# Patient Record
Sex: Female | Born: 1975 | Race: White | Hispanic: No | Marital: Married | Smoking: Never smoker
Health system: Southern US, Community
[De-identification: ages and names within clinical notes are randomized; demographics above are authoritative.]

## PROBLEM LIST (undated history)

## (undated) HISTORY — PX: ABDOMINAL HYSTERECTOMY: SHX81

---

## 2000-05-08 ENCOUNTER — Other Ambulatory Visit: Admission: RE | Admit: 2000-05-08 | Discharge: 2000-05-08 | Payer: Self-pay | Admitting: Obstetrics & Gynecology

## 2000-12-31 ENCOUNTER — Ambulatory Visit (HOSPITAL_COMMUNITY): Admission: RE | Admit: 2000-12-31 | Discharge: 2000-12-31 | Payer: Self-pay

## 2000-12-31 ENCOUNTER — Emergency Department (HOSPITAL_COMMUNITY): Admission: EM | Admit: 2000-12-31 | Discharge: 2000-12-31 | Payer: Self-pay

## 2001-02-25 ENCOUNTER — Emergency Department (HOSPITAL_COMMUNITY): Admission: EM | Admit: 2001-02-25 | Discharge: 2001-02-26 | Payer: Self-pay

## 2001-03-23 ENCOUNTER — Other Ambulatory Visit: Admission: RE | Admit: 2001-03-23 | Discharge: 2001-03-23 | Payer: Self-pay | Admitting: Gynecology

## 2001-10-24 ENCOUNTER — Observation Stay (HOSPITAL_COMMUNITY): Admission: AD | Admit: 2001-10-24 | Discharge: 2001-10-25 | Payer: Self-pay | Admitting: Gynecology

## 2001-10-25 ENCOUNTER — Encounter: Payer: Self-pay | Admitting: Gynecology

## 2001-10-27 ENCOUNTER — Inpatient Hospital Stay (HOSPITAL_COMMUNITY): Admission: AD | Admit: 2001-10-27 | Discharge: 2001-10-31 | Payer: Self-pay | Admitting: Gynecology

## 2001-12-09 ENCOUNTER — Other Ambulatory Visit: Admission: RE | Admit: 2001-12-09 | Discharge: 2001-12-09 | Payer: Self-pay | Admitting: *Deleted

## 2002-06-08 ENCOUNTER — Other Ambulatory Visit: Admission: RE | Admit: 2002-06-08 | Discharge: 2002-06-08 | Payer: Self-pay | Admitting: Gynecology

## 2002-11-24 ENCOUNTER — Inpatient Hospital Stay (HOSPITAL_COMMUNITY): Admission: AD | Admit: 2002-11-24 | Discharge: 2002-11-24 | Payer: Self-pay | Admitting: Obstetrics and Gynecology

## 2002-11-25 ENCOUNTER — Inpatient Hospital Stay (HOSPITAL_COMMUNITY): Admission: AD | Admit: 2002-11-25 | Discharge: 2002-11-25 | Payer: Self-pay | Admitting: Obstetrics and Gynecology

## 2002-11-29 ENCOUNTER — Inpatient Hospital Stay (HOSPITAL_COMMUNITY): Admission: AD | Admit: 2002-11-29 | Discharge: 2002-12-01 | Payer: Self-pay | Admitting: Obstetrics and Gynecology

## 2003-02-23 ENCOUNTER — Other Ambulatory Visit: Admission: RE | Admit: 2003-02-23 | Discharge: 2003-02-23 | Payer: Self-pay | Admitting: Obstetrics and Gynecology

## 2003-05-13 ENCOUNTER — Inpatient Hospital Stay (HOSPITAL_COMMUNITY): Admission: AD | Admit: 2003-05-13 | Discharge: 2003-05-13 | Payer: Self-pay | Admitting: Obstetrics and Gynecology

## 2003-09-08 ENCOUNTER — Encounter: Admission: RE | Admit: 2003-09-08 | Discharge: 2003-09-08 | Payer: Self-pay | Admitting: Sports Medicine

## 2004-07-05 ENCOUNTER — Emergency Department (HOSPITAL_COMMUNITY): Admission: EM | Admit: 2004-07-05 | Discharge: 2004-07-06 | Payer: Self-pay | Admitting: Emergency Medicine

## 2004-07-12 ENCOUNTER — Other Ambulatory Visit: Admission: RE | Admit: 2004-07-12 | Discharge: 2004-07-12 | Payer: Self-pay | Admitting: Obstetrics and Gynecology

## 2004-08-10 ENCOUNTER — Ambulatory Visit (HOSPITAL_COMMUNITY): Admission: RE | Admit: 2004-08-10 | Discharge: 2004-08-10 | Payer: Self-pay | Admitting: Obstetrics and Gynecology

## 2004-11-21 ENCOUNTER — Inpatient Hospital Stay (HOSPITAL_COMMUNITY): Admission: AD | Admit: 2004-11-21 | Discharge: 2004-11-21 | Payer: Self-pay | Admitting: Obstetrics and Gynecology

## 2005-04-09 ENCOUNTER — Inpatient Hospital Stay (HOSPITAL_COMMUNITY): Admission: AD | Admit: 2005-04-09 | Discharge: 2005-04-09 | Payer: Self-pay | Admitting: Obstetrics and Gynecology

## 2005-05-23 ENCOUNTER — Inpatient Hospital Stay (HOSPITAL_COMMUNITY): Admission: AD | Admit: 2005-05-23 | Discharge: 2005-05-23 | Payer: Self-pay | Admitting: Obstetrics and Gynecology

## 2005-06-04 ENCOUNTER — Inpatient Hospital Stay (HOSPITAL_COMMUNITY): Admission: AD | Admit: 2005-06-04 | Discharge: 2005-06-07 | Payer: Self-pay | Admitting: Obstetrics and Gynecology

## 2005-06-04 ENCOUNTER — Encounter (INDEPENDENT_AMBULATORY_CARE_PROVIDER_SITE_OTHER): Payer: Self-pay | Admitting: *Deleted

## 2005-09-26 ENCOUNTER — Encounter (INDEPENDENT_AMBULATORY_CARE_PROVIDER_SITE_OTHER): Payer: Self-pay | Admitting: Specialist

## 2005-09-26 ENCOUNTER — Ambulatory Visit (HOSPITAL_COMMUNITY): Admission: RE | Admit: 2005-09-26 | Discharge: 2005-09-27 | Payer: Self-pay | Admitting: Obstetrics and Gynecology

## 2009-09-07 ENCOUNTER — Emergency Department (HOSPITAL_COMMUNITY): Admission: EM | Admit: 2009-09-07 | Discharge: 2009-09-07 | Payer: Self-pay | Admitting: Emergency Medicine

## 2009-09-07 ENCOUNTER — Observation Stay (HOSPITAL_COMMUNITY): Admission: EM | Admit: 2009-09-07 | Discharge: 2009-09-08 | Payer: Self-pay | Admitting: Emergency Medicine

## 2009-09-07 ENCOUNTER — Ambulatory Visit: Payer: Self-pay | Admitting: Internal Medicine

## 2010-03-29 LAB — POCT CARDIAC MARKERS
Myoglobin, poc: 45.1 ng/mL (ref 12–200)
Troponin i, poc: <0.05 ng/mL (ref 0.00–0.09)

## 2010-03-29 LAB — DIFFERENTIAL
Basophils Absolute: 0 10*3/uL (ref 0.0–0.1)
Eosinophils Absolute: 0.1 10*3/uL (ref 0.0–0.7)
Lymphs Abs: 1.5 10*3/uL (ref 0.7–4.0)
Monocytes Relative: 7 % (ref 3–12)
Neutro Abs: 3.8 10*3/uL (ref 1.7–7.7)

## 2010-03-29 LAB — CBC
HCT: 39.2 % (ref 36.0–46.0)
Hemoglobin: 13 g/dL (ref 12.0–15.0)
MCH: 29.1 pg (ref 26.0–34.0)
MCHC: 33.2 g/dL (ref 30.0–36.0)
Platelets: 236 10*3/uL (ref 150–400)
RDW: 12.7 % (ref 11.5–15.5)

## 2010-03-29 LAB — POCT I-STAT, CHEM 8
BUN: 8 mg/dL (ref 6–23)
Calcium, Ion: 1.12 mmol/L (ref 1.12–1.32)
Chloride: 106 mEq/L (ref 96–112)
Creatinine, Ser: 1 mg/dL (ref 0.4–1.2)
Hemoglobin: 14.3 g/dL (ref 12.0–15.0)
Potassium: 4.2 mEq/L (ref 3.5–5.1)

## 2010-06-01 NOTE — H&P (Signed)
NAMEREDA, CITRON             ACCOUNT NO.:  [id number]   MEDICAL RECORD NO.:  [id number]           PATIENT TYPE:   LOCATION:                                [facility]   PHYSICIAN:  Dineen Kid. Rana Snare, M.D.    DATE OF BIRTH:  1975-07-13   DATE OF ADMISSION:  09/26/2005  DATE OF DISCHARGE:                                HISTORY & PHYSICAL   HISTORY OF PRESENT ILLNESS:  Ms. Stansel is a 35 year old G3, P3 with  worsening dyspareunia and pelvic pain over the last number of years.  She  recently underwent a cesarean section in May for __________ had her tubal  ligation at that time, but continued to have pain intermittently, chronic,  with predilection towards the left side.  She also does have a history of  endometriosis.  She desires definitive surgical intervention and requests a  hysterectomy and presents for this.   PAST MEDICAL HISTORY:  1. Significant for obesity.  2. __________ anxiety disorder with panic disorder, currently on Zoloft.   PAST SURGICAL HISTORY:  1. History of laparoscopy 3 years ago for endometriosis.  2. Also history of cesarean section in May with a tubal ligation.   PHYSICAL EXAMINATION:  VITAL SIGNS:  Blood pressure 112/80, weight is 305.9.  HEART:  Regular rate and rhythm.  LUNGS:  Clear to auscultation bilaterally.  ABDOMEN:  Obese, nontender.  PELVIC EXAM:  The uterus is anteverted and mobile, minimally tender to deep  palpation.  No adnexal masses are palpable.  Good descensus with Valsalva.  No significant cystocele or rectocele are noted.   Hemoglobin is 12.3, urinalysis is negative.   IMPRESSION/PLAN:  1. Pelvic pain, chronic; history of endometriosis; also dyspareunia.  This      is disabling for the patient at times.  She desires definitive surgical      intervention and desires laparoscopically assisted vaginal      hysterectomy.  2. Possible left salpingo-oophorectomy depending upon evaluation at the      time of the surgery.  3. She  does want preservation of the right ovary.   The risks and benefits of the procedure were discussed at length which;  include but are not limited to, risk of infection, bleeding, damage to the  bowel, bladder, ureters, ovaries, possibly this may not alleviate the pain,  and it could recur or worsen; this is associated with anesthesia, which is  associated with blood loss and blood transfusion.  She does give her  informed consent and wishes to proceed.      Dineen Kid Rana Snare, M.D.  Electronically Signed     DCL/MEDQ  D:  09/25/2005  T:  09/25/2005  Job:  [id number]

## 2010-06-01 NOTE — H&P (Signed)
[name], [name]             ACCOUNT NO.:  [id number]   MEDICAL RECORD NO.:  [id number]          PATIENT TYPE:  AMB   LOCATION:  SDC                           [facility]ILITY:  [facility]   PHYSICIAN:  Dineen Kid. Rana Snare, M.D.    DATE OF BIRTH:  Jan 16, 1975   DATE OF ADMISSION:  08/10/2004  DATE OF DISCHARGE:                                HISTORY & PHYSICAL   HISTORY OF PRESENT ILLNESS:  [name] is a 35 year old G2, P2 with  severe left pelvic pain.  Recently presented to the emergency room near the  end of June for this.  She does have a history of endometriosis.  Ultrasound  in the hospital shows a left ovarian cyst showing 2.3 cm in size with a  normal right appearing ovary.  Most of the acute pain has resolved but she  continues to have intermittent left pelvic pain, mostly with intercourse.  Currently on oral contraceptive agents.  She did have laparoscopy three or  four years ago.  Was placed on birth control pills after that for  endometriosis.  She presents for definitive surgical evaluation and  treatment of the pelvic pain.   PAST MEDICAL HISTORY:  1.  Depression.  2.  Anxiety.   PAST OBSTETRICAL HISTORY:  She has had two deliveries, both vaginally.   MEDICATIONS:  Currently on Ortho Tri-Cyclen and Cymbalta.   ALLERGIES:  SULFA.   PHYSICAL EXAMINATION:  VITAL SIGNS:  Blood pressure 108/72, weight 304.  HEART:  Regular rate and rhythm.  LUNGS:  Clear to auscultation bilaterally.  PELVIC:  Uterus is anteverted, mobile, nontender to deep palpation.  No  adnexal masses are palpable on pelvic examination.  However, ultrasound does  show a left ovarian cyst measuring 2.3 cm in size.   IMPRESSION AND PLAN:  Pelvic pain, chronic, but intermittent.  Ovarian cyst.  Pelvic pain with the left lower side predominant and history of  endometriosis.  All have been unresponsive to conservative medical  management.  Patient desires definitive surgical evaluation.  Will plan  laparoscopy  with possible CO2, laser ablation of endometriosis, possible  ovarian cystectomy, possible oophorectomy.  The procedure was discussed at  length with the patient.  Questions were answered and informed consent was  obtained.  She does understand that this may or may not alleviate the pain,  pain may recur.  There are risks associated with surgery including risk of  infection, bleeding, damage to uterus, tubes, ovaries, bowel, bladder, risks  associated with anesthesia, risks associated with blood transfusion.  She  gives her informed consent and wishes to proceed.     DCL/MEDQ  D:  08/10/2004  T:  08/10/2004  Job:  [id number]

## 2010-06-01 NOTE — Discharge Summary (Signed)
   [name], [name]                         ACCOUNT NO.:  [id number]   MEDICAL RECORD NO.:  [id number]                   PATIENT TYPE:  INP   LOCATION:  9135                                 [facility]ACILITY:  [facility]   PHYSICIAN:  Juan H. Lily Peer, M.D.             DATE OF BIRTH:  09-14-1975   DATE OF ADMISSION:  10/27/2001  DATE OF DISCHARGE:  10/31/2001                                 DISCHARGE SUMMARY   DISCHARGE DIAGNOSES:  1. Intrauterine pregnancy at term.  2. Pregnancy induced hypertension.  3. Bradycardia.  4. Occiput posterior position.   PROCEDURE:  Left forceps assisted vaginal delivery with delivery of viable  infant.   HISTORY OF PRESENT ILLNESS:  The patient is a 35 year old primigravida with  an LMP January 19, 2001, [facility] December 26, 2001.  Prenatal course complicated  by history of depression, anxiety.  The patient is on Zoloft 100 mg q.d.   LABORATORIES:  Blood type A+.  Antibody screen negative.  RPR, HBSAG, HIV  nonreactive.   HOSPITAL COURSE:  The patient was admitted at 40 weeks with elevated blood  pressures, nausea, vomiting, decreased fetal movement.  Induction of labor  was initiated with Cervidil followed by high dose Pitocin.  PIH panels were  drawn.  The patient did progress to complete dilatation and secondary to  bradycardia and OP position delivery was accomplished by low forceps  assisted vaginal delivery with repair of third degree laceration.  Infant  was female.  Birth weight 6 pounds 3 ounces.  Postpartum course patient  remained afebrile.  Had no difficulty voiding.  Was able to be discharged in  satisfactory condition on her second postpartum day.  CBC:  Hematocrit 25.1,  hemoglobin 8.6, platelets 175,000.   DISPOSITION:  Follow up in the office in six weeks postpartum visit.  Tylox.  Continue prenatal vitamins and iron.     Elwyn Lade . Hancock, N.P.                Gaetano Hawthorne. Lily Peer, M.D.    MKH/MEDQ  D:  01/04/2002  T:  01/04/2002  Job:   [id number]

## 2010-06-01 NOTE — Discharge Summary (Signed)
NAMEROLANDO, WHITBY             ACCOUNT NO.:  [id number]   MEDICAL RECORD NO.:  [id number]          PATIENT TYPE:  OIB   LOCATION:  9309                          [facility]   PHYSICIAN:  Dineen Kid. Rana Snare, M.D.    DATE OF BIRTH:  1975/03/11   DATE OF ADMISSION:  09/26/2005  DATE OF DISCHARGE:  09/27/2005                                 DISCHARGE SUMMARY   HISTORY OF PRESENT ILLNESS:  Ms. Channing is a 35 year old, G2, P3, with  worsening dyspareunia and pelvic pain over the last number of years.  Recently underwent a cesarean section with tubal ligation in May.  Is  continuing to have intermittent but chronic pain __________ left side.  She  desires definitive surgical intervention and presents for hysterectomy.   HOSPITAL COURSE:  The patient underwent a laparoscopically-assisted vaginal  hysterectomy.  The surgery was uncomplicated.  She did have estimated blood  loss of 500 cc.  Her postoperative care was unremarkable.  She had good  return of bowel function.  By postoperative day #1, was ambulating without  difficulty, tolerating a regular diet.  Had normoactive bowel sounds.  The  incision was clean, dry, and intact.  Postoperative hemoglobin was 9.6.  The  patient was discharged home to followup in the office in 1 week.  She was  sent home with a prescription for Tylox, #30.  She was told to return for  increased pain, fever or bleeding.      Dineen Kid Rana Snare, M.D.  Electronically Signed     DCL/MEDQ  D:  09/27/2005  T:  09/28/2005  Job:  [id number]

## 2010-06-01 NOTE — Op Note (Signed)
[name], [name]             ACCOUNT NO.:  [id number]   MEDICAL RECORD NO.:  [id number]          PATIENT TYPE:  OIB   LOCATION:  9309                          [facility]   PHYSICIAN:  Dineen Kid. Rana Snare, M.D.    DATE OF BIRTH:  07-28-1975   DATE OF PROCEDURE:  09/26/2005  DATE OF DISCHARGE:                                 OPERATIVE REPORT   PREOPERATIVE DIAGNOSIS:  Pelvic pain, dyspareunia, and morbid obesity.   POSTOPERATIVE DIAGNOSIS:  Pelvic pain, dyspareunia, and morbid obesity.   PROCEDURE:  Laparoscopically-assisted vaginal hysterectomy.   SURGEON:  Dineen Kid. Rana Snare, M.D.   ASSISTANT:  Zelphia Cairo, M.D.   ANESTHESIA:  General endotracheal.   INDICATIONS FOR PROCEDURE:  [name] is a 35 year old gravida 3, para  2, with worsening dyspareunia and pelvic pain over the last number of years.  She underwent a cesarean section in May and a tubal ligation at that time.  She has continued to have pain intermittently, but daily which is  interfering with her ability to have a normal life.  She desires definitive  surgical intervention and requests hysterectomy.  We plan to proceed with  laparoscopically-assisted vaginal hysterectomy.  The patient also does have  morbid obesity and history of endometriosis.  The risks and benefits were  discussed at length.  Informed consent was obtained.  See history and  physical for the details.   FINDINGS:  Slightly enlarged boggy uterus, normal appearing ovaries and  fallopian tubes, normal appearing liver.   DESCRIPTION OF PROCEDURE:  After adequate analgesia, the patient was placed  in the dorsal lithotomy position.  She was sterilely prepped and draped.  The bladder was sterilely drained.  Hulka tenaculum was placed on the  cervix.  A 1 cm infraumbilical skin incision was made, Veress needle was  inserted.  The abdomen was insufflated to dullness to percussion.  An 11 mm  trocar was then inserted.  The laparoscope was inserted.   The above findings  were noted.  A 5 mm trocar was inserted to the left of the midline two  fingerbreadths above the pubic symphysis.  The Gyrus grasping forceps were  used to grasp across the left utero-ovarian ligament, coagulating and  dissecting across the infundibulopelvic ligament, across the fallopian tube,  and down across the round ligament on the left side.  This was done  similarly across the utero-ovarian ligament on the right side, down across  the round ligament.  The bladder was elevated and a small window was made at  the uterovesical junction.  Legs were repositioned.  Abdomen was  desufflated.  Posterior colpotomy was performed.  The cervix was  circumscribed with Bovie cautery.  A LigaSure instrument was used to ligate  across the uterosacral ligaments bilaterally, the cardinal ligaments  bilaterally, the bladder pillars bilaterally, dissected with Mayo scissors,  and then the anterior vaginal mucosa was dissected off the bladder.  A  Deaver retractor was placed into the anterior peritoneum protecting the  bladder.  The inferior portions of the broad ligament were then clamped with  LigaSure, ligated, and cut with Mayo  scissors.  The uterus was then removed.  Uterosacral ligaments were ligated with 0 Monocryl suture.  Small bleeders  were noted and coagulated with LigaSure instrument.  Posterior peritoneum  was closed with pursestring fashion using 0 Monocryl suture.  The posterior  vaginal mucosa was closed in a vertical fashion using figure-of-eights of 0  Monocryl.  The speculum was removed and the anterior vaginal mucosa was  closed in a vertical using figure-of-eights of 0 Monocryl.  There were  several small lacerations near the apex from the retractor that were  repaired with 0 Monocryl suture and Bovie cautery.  Small packing was placed  into the vagina for direct pressure at this time.  Foley catheter was placed  and clear yellow urine returned.  The legs were  repositioned, the abdomen  reinsufflated and the Nezhat suction irrigator was used to irrigate the  pelvis.  Small peritoneal bleeders were grasped and coagulated with bipolar  cautery and after copious amount of irrigation and adequate hemostasis was  assured, reexamination of the pedicles revealed good hemostasis.  No  underlying injury to the bowel or ureter were noted.  The abdomen was then  desufflated.  The infraumbilical trocar was removed and closed with 0 Vicryl  interrupted suture in the fascia, 3-0 Vicryl Rapide subcuticular suture.  5  mm site was closed with 3-0 Vicryl Rapide subcuticular suture.  The  incisions were injected with 0.25% Marcaine total of 10 mL used.  The  patient was stable on transfer to the recovery room.  Needle, sponge, and  instrument counts correct x3.  Estimated blood loss was 500 mL mostly due to  bleeding from the vaginal cuff.  The patient received 1 gram of Rocephin  preoperatively.      Dineen Kid Rana Snare, M.D.  Electronically Signed     DCL/MEDQ  D:  09/26/2005  T:  09/26/2005  Job:  [id number]

## 2010-06-01 NOTE — Op Note (Signed)
[name], [name]             ACCOUNT NO.:  [id number]   MEDICAL RECORD NO.:  [id number]          PATIENT TYPE:  AMB   LOCATION:  SDC                           [facility]   PHYSICIAN:  Dineen Kid. Rana Snare, M.D.    DATE OF BIRTH:  Sep 03, 1975   DATE OF PROCEDURE:  08/10/2004  DATE OF DISCHARGE:                                 OPERATIVE REPORT   PREOPERATIVE DIAGNOSES:  1.  Pelvic pain chronic in nature.  2.  Ovarian cyst.  3.  History of endometriosis.   POSTOPERATIVE DIAGNOSES:  1.  Pelvic pain chronic in nature.  2.  Ovarian cyst.  3.  History of endometriosis.  4.  Probable adenomyosis.   PROCEDURE:  Diagnostic laparoscopy.   SURGEON:  Dineen Kid. Rana Snare, M.D.   ANESTHESIA:  General endotracheal.   INDICATIONS FOR PROCEDURE:  [name] is a 35 year old, G2, P2 with  severe left pelvic pain recently seen in the emergency room for this. She  does have a history of endometriosis. Ultrasound at the hospital showed a  left ovarian cyst measuring 2.3 cm in size. The acute pain has mostly  resolved but she does continue to have intermittent pain mostly with  predominance on the left side and mostly with intercourse. Currently on oral  contraceptive agents. She did have a laparoscopy 3-4 years ago showing mild  endometriosis and was placed on birth control pills since then. She presents  for definitive surgical intervention and evaluation of this.   FINDINGS:  Normal appearing left tube and ovary. The right tube and ovary  are also normal. The uterus is slightly enlarged and consistent with  adenomyosis. Cul-de-sac normal, no evidence of endometriosis noted. The  appendix is retrocecal.   DESCRIPTION OF PROCEDURE:  After adequate analgesia, the patient was placed  in the dorsal lithotomy position, she was sterilely prepped and draped, the  bladder sterilely drained. A Grave's speculum was placed, tenaculum was  placed on the anterior lip of the cervix. Once an infraumbilical  incision  was made, the Veress needle was inserted, the abdomen was insufflated with  dullness to percussion. An 11 mm trocar was inserted, the laparoscope was  inserted, the above findings were noted. A 5 mm trocar was inserted to the  left of the midline, two fingerbreadth's above the pubic symphysis under  direct visualization. After careful examination throughout the pelvis and  cul-de-sac, no evidence of endometriosis was noted. Normal appearing tubes  and ovaries with special attention paid towards the left adnexa. No obvious  cause of the left adnexal pain. The uterus was slightly enlarged and when  placing a probe underneath the elevate the uterus it was very boggy  consistent with adenomyosis. At this time, the abdomen was then desufflated,  trocars removed, the infraumbilical skin incision was closed with a #0  Vicryl interrupted suture, 4-0 Vicryl Rapide subcuticular suture. The 5 mm  site was closed with a 4-0 Vicryl Rapide subcuticular suture. The tenaculum  was removed and the cervix noted to be hemostatic. The patient was then  transferred to the recovery room in stable condition. Sponge,  needle and  instrument count was normal x3. Estimated blood loss was minimal. The  patient received 1 g of Rocephin preoperatively, 30 mg of Toradol  postoperatively.   DISPOSITION:  The patient will be discharged home, followup in the office in  2-3 weeks. Sent home with a routine instruction sheet for laparoscopy and  told to return for increased pain, fever or bleeding.       DCL/MEDQ  D:  08/10/2004  T:  08/10/2004  Job:  [id number]

## 2010-06-01 NOTE — H&P (Signed)
[name], [name]                         ACCOUNT NO.:  [id number]   MEDICAL RECORD NO.:  [id number]                   PATIENT TYPE:  INP   LOCATION:  9174                                 [facility]   PHYSICIAN:  Juan H. Lily Peer, M.D.             DATE OF BIRTH:  [birth year]   DATE OF ADMISSION:  10/27/2001  DATE OF DISCHARGE:                                HISTORY & PHYSICAL   CHIEF COMPLAINT:  1. Elevated blood pressure.  2. Nausea and vomiting.  3. Decreased fetal movement.   HISTORY OF PRESENT ILLNESS:  The patient is a 36 year old G1, P0 last  menstrual period January 29, 2001 with estimated date of confinement October 26, 2001 40-1/7ths weeks' gestation.  Patient was seen in the office today  per follow up instructions since her discharge from the hospital this past  weekend which she was admitted due to the fact that she had called  complaining of elevated blood pressure.  She had taken her blood pressure at  home and thought that it was elevated.  She was also found to have some  variable decelerations during monitoring in maternity admission and was  admitted for observation for [facility] panels and monitoring.  Her PIH panels were  reported to be normal.  She was asymptomatic and her blood pressure returned  back to normal and she was discharged home on October 24, 2001 and was  instructed to return to the office for a follow up appointment.  She  returned today complaining of decreased fetal movement.  She has recently  been treated for a urinary tract infection with Keflex.  Due to the nausea  and vomiting she has had the week to ten days prior, she has not been able  to keep the antibiotic down.  Due to the fact that the patient is obese and  weighs 398 pounds, a nonstress test will be done in the hospital when she is  admitted for induction.  Biophysical profile was done in the office with  score of 6:8 and 2 were taken off for breathing.  Estimated fetal weight  was  recorded 7 pounds 1 ounce with 19th percentile for 40-week gestation.  AFI  was 11.9, 45th percentile for 40 weeks'.  Patient's blood pressure in the  office was 140/90, 130/88 and on her side was 120/68 and 128/82.  She did  have 2-3+ pitting edema.  She denied any right upper quadrant pain, visual  disturbances or any headache at the present time except for the vomiting she  had for three days.  She will be admitted for induction this evening.  She  was seen in the office at approximately 18:30 hours on October 27, 2001.   PRENATAL COURSE:  Her first prenatal blood pressure was 124/82.  She has had  negative proteinuria throughout her pregnancy so far.  She has been on  Lexapro 100 mg q.d. for  depression and anxiety.   PAST MEDICAL HISTORY:  Patient, as mentioned above, has history of  depression and anxiety and is on Lexapro 100 mg q. daily.   PAST SURGICAL HISTORY:  She has history of laparoscopy in 2002 when she was  diagnosed with having endometriosis.   ALLERGIES:  She is allergic to SULFA and MACROBID.   REVIEW OF SYSTEMS:  See hospital form.   PHYSICAL EXAMINATION:  GENERAL:  Weight 398 pounds.  Blood pressure 130/90,  130/88 on left side 120/68, 128/82.  HEENT:  Unremarkable.  Neck supple.  Trachea midline.  No carotid bruits or  thyromegaly.  LUNGS:  Clear to auscultation without rhonchi or wheezes.  HEART:  Regular rate and rhythm without murmurs, rubs or gallops.  BREAST:  Not examined.  ABDOMEN:  Fundal height 41 cm, vertex presentation by Thayer Ohm maneuver.  PELVIC:  70% effaced, closed.  EXTREMITIES:  Deep tendon reflexes 2+, 3+ pitting edema, negative clonus.   LABORATORY DATA:  Prenatal laboratories:  Blood type A+, negative antibody  screen, VDRL nonreactive, rubella immune, hepatitis B surface antigen and  HIV negative.  She had abnormal diabetes screening after Glucola but had  normal three-hour GGT.  She had negative GBS culture and Pap smear was   normal.  Patient had an ultrasound and biophysical profile today with a  biophysical profile of 6:8 with 2 taken off for breathing.  Estimated fetal  weight 7 pounds 1 ounce, 19th percentile, vertex presentation, AFI normal at  11.9 in the 45th percentile for 40 weeks'.   ASSESSMENT:  35 year old G1, P0 at 40-1/7ths weeks' with continuation of  nausea and elevated blood pressures, edema.  No visual disturbances, right  upper quadrant pain or severe headache.  Patient previously seen in the  hospital this past weekend, monitored blood pressures had improved and PIH  panels were reported to be normal.  She was released home and now presented  today to the office late in the afternoon complaining of decreased fetal  movement and now with findings of BPP of 6:8, elevated blood pressure and  the pitting edema. It appears the patient has a variant of PIH underlying.  Will go ahead and admit for cervical ripening with Cervidil tonight and with  high-dose Pitocin at 6:00 a.m.  The patient was fully informed of the  potential scenarios to include the following:  Serial induction requiring  one or two days if the cervix is unfavorable and if, only if, the patient is  normotensive and antepartum testing consisting also of nonstress test and  PIH panel are normal.  If not, or blood pressures continue to escalate,  surgical intervention via cesarean section may need to be undertaken.  The  patient is fully aware of this and will follow up accordingly.   PLAN:  1. Admit this evening to [facility].  2. PIH panels to be drawn.  3. Continue fetal monitoring as well as vital signs monitoring.  4. Cervical placement for cervical ripening followed by initiation of     Pitocin high-dose at 6:00 a.m.  5. Risks, benefits and pros and cons of all of the above discussed with     patient in full detail.  All questions were answered.  Follow up     accordingly.  Juan H. Lily Peer, M.D.    JHF/MEDQ  D:  10/27/2001  T:  10/27/2001  Job:  [id number]

## 2010-06-01 NOTE — Op Note (Signed)
[name], [name]             ACCOUNT NO.:  [id number]   MEDICAL RECORD NO.:  [id number]          PATIENT TYPE:  INP   LOCATION:  9131                          FACILITY:  [facility]   PHYSICIAN:  Guy Sandifer. Henderson Cloud, M.D. DATE OF BIRTH:  14-May-1975   DATE OF PROCEDURE:  06/04/2005  DATE OF DISCHARGE:                                 OPERATIVE REPORT   PREOPERATIVE DIAGNOSIS:  1.  Intrauterine pregnancy at 39-0/7 weeks estimated additional age.  2.  Fetal macrosomia.  3.  Desires permanent sterilization.   POSTOPERATIVE DIAGNOSIS:  1.  Intrauterine pregnancy at 39-0/7 weeks estimated additional age.  2.  Fetal macrosomia.  3.  Desires permanent sterilization.   PROCEDURE:  1.  Low transverse cesarean section.  2.  Bilateral tubal ligation.   SURGEON:  Guy Sandifer. Henderson Cloud, M.D.   ASSISTANT:  Dineen Kid. Rana Snare, M.D.   ANESTHESIA:  Spinal, Cristela Blue, M.D.   ESTIMATED BLOOD LOSS:  800 mL.   SPECIMENS:  Placenta and bilateral tubal segments.   FINDINGS:  Viable female infant, Apgars of 8 and 9 at 1 and 5 inches  respectively.  Birth weight 8 pounds 1 ounce.  Arterial cord pH 7.33.   INDICATIONS AND CONSENT:  The patient is 35 year old married white female  G3, P2, with an EDC of Jun 11, 2005.  Prenatal care has been complicated by  a history of PIH with her previous pregnancies.  She has also been size  greater than dates.  Estimated fetal weight 2 1/2 weeks' ago was 7 pounds 9  ounces at the 93rd percentile.  She has a history of difficult deliveries  with two babies under 7 pounds.  Cervical exam is 3 cm but -3 station.  Today in the office, her blood pressure was noted to have a diastolic of  100.  She was without central nervous system changes or epigastric pain.  After discussing the options with the patient, the patient desires primary  cesarean section and bilateral tubal ligation.  The potential risks and  complications have been discussed preoperatively including but limited  to  infection, bowel, bladder, or ureteral damage, bleeding requiring  transfusion of blood products with possible transfusion reaction, HIV,  hepatitis acquisition.  Tubal ligation with the permanence, failure rate,  and increased ectopic risk has also been reviewed.  All questions were  answered and consent is signed on the chart.   PROCEDURE:  The patient is taken to the operating room where she is  identified, spinal anesthetic is placed, and she is placed in dorsal supine  position with a 15 degrees left lateral wedge.  She is then prepped, Foley  catheter was placed, the bladder was drained, and she is draped in a sterile  fashion.  After testing for adequate spinal anesthesia, the skin was entered  through a Pfannenstiel incision and dissection is carried out in layers to  the peritoneum.  The peritoneum is entered sharply and extended bluntly.  Vesicouterine peritoneum was taken down cephalad laterally.  The bladder  flap was developed and the bladder blade was placed.  The uterus was incised  in a low transverse manner and the uterine cavity is entered bluntly with a  hemostat.  The uterine incision was then extended cephalad and laterally  with the fingers.  Artificial rupture of membranes with clear fluid is  carried out.  Vertex delivered and oronasal pharynx was suctioned.  A double  nuchal cord was then reduced.  The baby is delivered with good cry and tone  noted.  The cord was clamped and cut and the baby is handed to the awaiting  pediatrics team.  The placenta was manually delivered.  The uterus is  delivered from the incision.  The cavity is cleaned.  The uterus was closed  in a running locking layer of 0 Monocryl suture.  To control bleeding at the  left angle, a figure-of-eight and then an O'Leary stitch is placed with the  Monocryl.  Care was taken to protect structures behind the uterus when this  was carried out.  The left fallopian tube was identified from cornu  to  fimbria and grasped in its mid ampullary portion with a Babcock clamp.  The  knuckle of tube was then doubly ligated with two free ties of 0 plain  suture.  The intervening knuckle was then sharply resected.  Cautery is  applied to assure complete hemostasis.  A similar procedure was carried out  on the right side.  The uterus then returned to the abdomen.  Careful  inspection reveals continued hemostasis at all sites.  The anterior  peritoneum was then closed in a running fashion with 0 Monocryl suture which  was also used to reapproximate the pyramidalis muscle in the midline.  The  rectus fascia is then closed in a running manner with the 0 PDS suture  starting at each angle and meeting in the middle.  The skin is closed with  clips.  All sponge, instrument, and needle counts were correct and the  patient is transferred to the recovery room in stable condition.      Guy Sandifer Henderson Cloud, M.D.  Electronically Signed     JET/MEDQ  D:  06/04/2005  T:  06/04/2005  Job:  [id number]

## 2010-06-01 NOTE — Discharge Summary (Signed)
[name], [name]             ACCOUNT NO.:  [id number]   MEDICAL RECORD NO.:  [id number]          PATIENT TYPE:  INP   LOCATION:  9131                          [facility]ILITY:  [facility]   PHYSICIAN:  Zelphia Cairo, MD    DATE OF BIRTH:  1975/10/07   DATE OF ADMISSION:  06/04/2005  DATE OF DISCHARGE:  06/07/2005                                 DISCHARGE SUMMARY   ADMISSION DIAGNOSES:  1.  Intrauterine pregnancy at 2 weeks' estimated gestational age.  2.  Fetal macrosomia.  3.  Multiparity, desires permanent sterilization.   DISCHARGE DIAGNOSES:  1.  Status post low transverse cesarean section.  2.  A viable female infant.  3.  Permanent sterilization.   PROCEDURES:  1.  Primary low transverse cesarean section.  2.  Bilateral tubal ligation.   REASON FOR ADMISSION:  Please see written H&P.   HOSPITAL COURSE:  The patient is 35 year old white married female, gravida  3, para 2, that was admitted to [facility] at 32 weeks'  estimated gestational age.  The patient did have a history of PIH with her  previous pregnancies.  This pregnancy is also complicated by macrosomia.  The patient had had an ultrasound 2-1/2 weeks prior to admission, which had  revealed a fetus that was weighing 7 pounds 9 ounces which, was at the 93rd  percentile.  The patient had a history of difficult delivery with two baby's  under 7 pounds.  Cervical exam had revealed cervix dilated to 3 cm; however,  vertex was at a -3 station.  The patient had been seen in the office earlier  in the day with blood pressure noted to have a diastolic of 100.  The  patient did deny any central nervous system changes or epigastric pain.  After reviewing previous history and cervical exam, a decision was made to  proceed with a primary low transverse cesarean section.  Due to multiparity,  the patient also requested permanent sterilization.  The patient was then  admitted to [facility], where she  was then transferred to  the operating room where spinal anesthesia was administered without  difficulty.  A low transverse incision was made with delivery of a viable  female infant weighing 8 pounds 1 ounce with Apgars of 8 at one and 9 at five  minutes.  Arterial cord pH was 7.33.  Permanent sterilization was performed  without difficulty.  The patient tolerated the procedure well and was  transferred to the recovery room in stable condition.  On postoperative day  #1 the patient was without complaint.  Vital signs remained stable.  Blood  pressure was 100/58 to 105/62.  Abdomen is soft with good return of bowel  function.  Fundus is firm and nontender.  Abdominal dressing was noted to be  clean, dry and intact.  Laboratory findings revealed hemoglobin of 8.2,  platelet count 150,000, WBC count of 8.0.  The patient was started on some  iron supplementation.  On postoperative day #2 the patient was without  complaint other than some soreness.  She denied headache, blurred vision or  right  upper quadrant pain.  Vital signs were stable with blood pressure  109/67 to 122/83.  Abdomen soft.  Fundus was firm and nontender.  Abdominal  dressing had been removed revealing an incision that was clean, dry and  intact.  The patient was ambulating well and tolerating a regular diet  without complaints of nausea and vomiting.  On postoperative day #3 the  patient was doing well.  She has good control of her pain with oral  narcotics.  She denied any nausea, vomiting, vital signs were stable.  She  was afebrile.  Abdomen soft.  Fundus was firm and nontender.  Incision was  clean, dry and intact.  Staples were removed and the patient was discharged  home.   CONDITION ON DISCHARGE:  Good.   DIET:  Regular as tolerated.   ACTIVITY:  To do no heavy lifting, no driving x2 weeks, no vaginal entry.   FOLLOW-UP:  Patient to follow up in the office in 1 week for an incision  check.  She is to call for  temperature greater than 100 degrees, persistent  nausea or vomiting, heavy vaginal bleeding and/or redness or drainage from  the incisional site.  The patient was also instructed to call for headache,  blurred vision or right upper quadrant pain.   DISCHARGE MEDICATIONS:  1.  Percocet 5/325, #30, one p.o. every 4-6 hour p.r.n.  2.  Motrin 600 mg every 6 hours.  3.  Prenatal vitamins one p.o. daily.  4.  Colace one p.o. daily p.r.n.      Julio Sicks, N.P.      Zelphia Cairo, MD  Electronically Signed    CC/MEDQ  D:  06/24/2005  T:  06/24/2005  Job:  [id number]

## 2010-06-01 NOTE — H&P (Signed)
NAMEBRAYLINN, [name]                         ACCOUNT NO.:  [id number]   MEDICAL RECORD NO.:  [id number]                   PATIENT TYPE:  INP   LOCATION:  9168                                 [facility]ILITY:  [facility]   PHYSICIAN:  Guy Sandifer. Arleta Creek, M.D.           DATE OF BIRTH:  1975/02/19   DATE OF ADMISSION:  11/29/2002  DATE OF DISCHARGE:                                HISTORY & PHYSICAL   CHIEF COMPLAINT:  Uterine contractions.   HISTORY OF PRESENT ILLNESS:  This patient is a 35 year old white female, G2,  P1, with an EDC of December 12, 2002, placing her at 38-1/7 weeks, who has  been noted to have 1+ proteinuria for the last two weeks or so.  Blood  pressures have been generally 120s/80-90.  On the day of admission she  complains of a frontal headache.  She denies vision changes.  She complains  of some mild epigastric discomfort but no nausea or vomiting.  Denies  leaking fluid or vaginal bleeding, but she does have contractions every five  minutes.  In the office blood pressure was 130/90, there was trace protein  in the urine, and her weight is 321 pounds.  Fetal heart tones are  auscultated.  After discussion of the options, she is being admitted for  induction of labor as well as further evaluation.   PAST MEDICAL HISTORY:  See prenatal form.   PAST SURGICAL HISTORY:  See prenatal form.   FAMILY HISTORY:  See prenatal form.   PAST OBSTETRICAL HISTORY:  See prenatal form.   MEDICATIONS:  Prenatal vitamins.   ALLERGIES:  SULFA.   PHYSICAL EXAMINATION:  VITAL SIGNS:  Weight 321 pounds.  CHEST:  Lungs clear to auscultation.  CARDIAC:  Regular rate and rhythm.  BACK:  Without CVA tenderness.  BREASTS:  Not examined.  ABDOMEN:  Obese, gravid, with mild epigastric tenderness.  The uterus is  soft and nontender.  PELVIC:  Vulva, vagina, and cervix without lesion.  Cervix is 4 cm dilated,  50% effaced, vertex with a high presenting part.  EXTREMITIES:  3+ dependent edema,  and the reflexes are 3+ bilaterally  without clonus.   LABORATORY DATA:  Group B beta strep culture is negative.  Blood type A  positive.  Rh antibody screen negative.  RPR nonreactive.  Rubella titer  positive.  Hepatitis B surface antigen nonreactive.  HIV nonreactive.  PPD  negative.  Gonorrhea and Chlamydia negative.    ASSESSMENT:  Intrauterine pregnancy at 38-1/7 weeks with contractions.   PLAN:  Will admit to the hospital for Pitocin induction of labor.  Will  check laboratories to rule out toxemia.                                               Guy Sandifer  Arleta Creek, M.D.    JET/MEDQ  D:  11/29/2002  T:  11/29/2002  Job:  [id number]

## 2010-06-01 NOTE — Discharge Summary (Signed)
   NAMEJALISIA, [name]                         ACCOUNT NO.:  [id number]   MEDICAL RECORD NO.:  [id number]                   PATIENT TYPE:  INP   LOCATION:  9135                                 [facility]   PHYSICIAN:  Juan H. Lily Peer, M.D.             DATE OF BIRTH:  1975-01-21   DATE OF ADMISSION:  10/27/2001  DATE OF DISCHARGE:  10/31/2001                                 DISCHARGE SUMMARY   DISCHARGE DIAGNOSES:  1. Intrauterine pregnancy 40+ weeks, delivered.  2. Pregnancy induced hypertension.  3. Status post forceps assisted vaginal delivery.  4. Anxiety and depression.   HISTORY:  This is a 35 year old female gravida 1, para 0 with an EDC of  October 26, 2001.  Prenatal course was complicated by anxiety and depression  and was on Lexapro for same.  She also was had been admitted prior to the  delivery admission for elevated blood pressure and had been discharged home  on October 24, 2001.  She followed up to the office on October 27, 2001  complaining of nausea and vomiting, increased blood pressure 140/90, 2-3+  pitting edema and patient was admitted for [facility] for induction.   HOSPITAL COURSE:  On October 27, 2001 patient was admitted at 40+ weeks.  Cervidil was placed and high dose Pitocin was begun on October 28, 2001.  She did receive 2 g of Cefotan for a UTI on admission.  On October 28, 2001  cervix was tight, fingertip, closed, 80%, and high.  Pitocin was turned off.  Cervidil was again placed and Pitocin ______ was begun on October 29, 2001.  On October 29, 2001 at 1555 patient underwent a forceps assisted vaginal  delivery secondary to bradycardia, OP position and underwent delivery of a  female, Apgars of 6 and 8, weight 6 pounds 3 ounces.  There was a partial  third degree laceration that was repaired without complications.  Postpartum  patient remained afebrile, voiding, stable condition.  She was discharged to  home on October 30, 2001 and given [facility]  Gynecology postpartum  instruction/postpartum booklet.   ACCESSORY CLINICAL FINDINGS:  Laboratories:  The patient is A+.  Rubella  immune.  On October 30, 2001 hemoglobin was 8.6.   DISPOSITION:  The patient is discharged to home.  Informed to return to  office six weeks.  Any problems prior to that time to be seen in the office.  Given a prescription for Tylox to take p.r.n. pain, iron daily, continue  Lexapro 10 mg p.o. q.d.     Susa Loffler, P.A.                    Juan H. Lily Peer, M.D.    TSG/MEDQ  D:  12/25/2001  T:  12/25/2001  Job:  [id number]

## 2010-06-01 NOTE — Discharge Summary (Signed)
   NAMESHANDIE, BERTZ                         ACCOUNT NO.:  [id number]   MEDICAL RECORD NO.:  [id number]                   PATIENT TYPE:  OBV   LOCATION:  9165                                 [facility]   PHYSICIAN:  Ivor Costa. Farrel Gobble, M.D.              DATE OF BIRTH:  12-05-1975   DATE OF ADMISSION:  10/24/2001  DATE OF DISCHARGE:  10/25/2001                                 DISCHARGE SUMMARY   DISCHARGE DIAGNOSES:  1. Intrauterine pregnancy 39 weeks, undelivered.  2. Increased blood pressure at home.   HISTORY:  This is a 35 year old female gravida 1 with EDC of October 26, 2001.  Prenatal course had been complicated by anxiety on Zoloft.   HOSPITAL COURSE:  On October 24, 2001 patient called in today on call about  increased blood pressure at home.  The patient had headache and was asked to  treat with Tylenol, then went to triage for nonstress test and laboratory  testing.  All laboratory testing was normal.  Nonstress test reactive,  episodic variables.  BPP was ordered which was within normal limits and  patient was discharged to home to follow up in the office on October 27, 2001 with a nonstress test.  If she had any problem prior to that time to be  seen in the office.     Susa Loffler, P.A.                    Ivor Costa. Farrel Gobble, M.D.    TSG/MEDQ  D:  01/15/2002  T:  01/15/2002  Job:  [id number]

## 2012-04-01 ENCOUNTER — Ambulatory Visit (INDEPENDENT_AMBULATORY_CARE_PROVIDER_SITE_OTHER): Payer: BC Managed Care – PPO | Admitting: Physician Assistant

## 2012-04-01 ENCOUNTER — Encounter: Payer: Self-pay | Admitting: Physician Assistant

## 2012-04-01 VITALS — BP 118/90 | HR 86 | Temp 98.5°F | Resp 16 | Ht 67.0 in | Wt 372.0 lb

## 2012-04-01 DIAGNOSIS — L408 Other psoriasis: Secondary | ICD-10-CM

## 2012-04-01 DIAGNOSIS — L409 Psoriasis, unspecified: Secondary | ICD-10-CM | POA: Insufficient documentation

## 2012-04-01 DIAGNOSIS — R002 Palpitations: Secondary | ICD-10-CM

## 2012-04-01 DIAGNOSIS — F411 Generalized anxiety disorder: Secondary | ICD-10-CM

## 2012-04-01 DIAGNOSIS — Z131 Encounter for screening for diabetes mellitus: Secondary | ICD-10-CM

## 2012-04-01 LAB — GLUCOSE, POCT (MANUAL RESULT ENTRY): POC Glucose: 108 mg/dl — AB (ref 70–99)

## 2012-04-01 MED ORDER — SERTRALINE HCL 50 MG PO TABS: 50.0000 mg | ORAL_TABLET | Freq: Every day | ORAL | Status: DC

## 2012-04-01 NOTE — Progress Notes (Signed)
   [address], [place] [address]   Phone [phone]  Subjective:    Patient ID: [name], female    DOB: 1975-10-13, 37 y.o.   MRN: [id number]  HPI Pt presents to clinic with increasing anxiety over the last year.  She has had problems with anxiety for a long time.  She was on Paxil which worked well until she got pregnant 11 years ago.  Then she was put on Zoloft which really helped and due to not getting a refill and thinking she was fine she went off her Zoloft about 2 years ago and now she knows that was not a great idea.  She is married with 3 boys (10,8,7) and she has a wonderful supportive husband.  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  She is current on weight watchers and has lost 40 #, she has h/o stress eating but lately she has not had an appetite.  Her main problem is that she does not want to leave to house and she is not sleeping well at night.  She is terrified of death and though it got worse when she became a mother she has always been afraid of what will happen to her after she dies.  She is ready to go back on her Zoloft. She has noticed that recently she has been getting shaky and having palpitates which definitely cause her anxiety to get worse but she is unsure is her anxiety is actually causing them or if she has DM or heart problems.  She did not have gestational DM.     Paxil - helped - off when pregnant Prozac - no help Review of Systems  Constitutional: Positive for appetite change (decrease). Negative for unexpected weight change.  Psychiatric/Behavioral: Positive for sleep disturbance and dysphoric mood. Negative for suicidal ideas. The patient is nervous/anxious.        Objective:   Physical Exam  Vitals reviewed. Constitutional: She is oriented to person, place, and time. She appears well-developed and well-nourished.  HENT:  Head: Normocephalic and atraumatic.  Right Ear: External ear normal.  Left Ear: External ear normal.  Cardiovascular: Normal  rate, regular rhythm and normal heart sounds.   Pulmonary/Chest: Effort normal and breath sounds normal.  Neurological: She is alert and oriented to person, place, and time.  Skin: Skin is warm and dry.  Psychiatric: She has a normal mood and affect. Her behavior is normal. Judgment and thought content normal.   EKG - NSR without acute changes.     Assessment & Plan:  Screening for diabetes mellitus (DM) - MIldly elevated glucose - Pt plans to make an appt for a CPE.  Plan: POCT glucose (manual entry)  Palpitations -Believe related to her anxiety.   Plan: EKG 12-Lead  Anxiety state, unspecified - Plan: sertraline (ZOLOFT) 50 MG tablet - Will recheck pt in 1 month to assess her status.  Pt is unsure what her last dose of Zoloft was so we will titrate to effect.  Psoriasis - gets worse with stress.

## 2012-04-27 ENCOUNTER — Other Ambulatory Visit: Payer: Self-pay | Admitting: Physician Assistant

## 2012-05-13 ENCOUNTER — Ambulatory Visit: Payer: BC Managed Care – PPO | Admitting: Physician Assistant

## 2012-05-15 ENCOUNTER — Telehealth: Payer: Self-pay

## 2012-05-15 NOTE — Telephone Encounter (Signed)
[name] - patient calling due to [place] cancelling her appointment. Patient states that she was put on Zoloft and was told to contact office if it did not appear to be working for her.  Her appointment has been cancelled and it is not working at the current dose. Can we increase her dosage until she can come in.  Her best number is [phone]

## 2012-05-20 MED ORDER — SERTRALINE HCL 100 MG PO TABS: 100.0000 mg | ORAL_TABLET | Freq: Every day | ORAL | Status: DC

## 2012-05-20 NOTE — Telephone Encounter (Signed)
Left mssg for her to call me back

## 2012-05-20 NOTE — Telephone Encounter (Signed)
Absolutely - she should double her dose to Zoloft 100mg  to finish up her current supply and I will send in a new Rx for that dosage.  Please let her know that I am sorry that I had to cancel her appt but I look forward to seeing her in a couple of weeks.

## 2012-05-22 NOTE — Telephone Encounter (Signed)
Called her again, left another message for her to call me back.

## 2012-05-25 NOTE — Telephone Encounter (Signed)
Unable to reach letter sent

## 2012-05-27 ENCOUNTER — Ambulatory Visit: Payer: BC Managed Care – PPO | Admitting: Physician Assistant

## 2012-06-10 ENCOUNTER — Ambulatory Visit: Payer: BC Managed Care – PPO | Admitting: Physician Assistant

## 2012-11-15 ENCOUNTER — Emergency Department (HOSPITAL_COMMUNITY): Payer: BC Managed Care – PPO

## 2012-11-15 ENCOUNTER — Emergency Department (HOSPITAL_COMMUNITY)
Admission: EM | Admit: 2012-11-15 | Discharge: 2012-11-15 | Disposition: A | Discharge status: Home / Self Care | Payer: BC Managed Care – PPO | Attending: Emergency Medicine | Admitting: Emergency Medicine

## 2012-11-15 ENCOUNTER — Encounter (HOSPITAL_COMMUNITY): Payer: Self-pay | Admitting: Emergency Medicine

## 2012-11-15 DIAGNOSIS — M79609 Pain in unspecified limb: Secondary | ICD-10-CM

## 2012-11-15 DIAGNOSIS — M25569 Pain in unspecified knee: Secondary | ICD-10-CM | POA: Insufficient documentation

## 2012-11-15 DIAGNOSIS — M79604 Pain in right leg: Secondary | ICD-10-CM

## 2012-11-15 DIAGNOSIS — Z79899 Other long term (current) drug therapy: Secondary | ICD-10-CM | POA: Insufficient documentation

## 2012-11-15 LAB — BASIC METABOLIC PANEL
BUN: 15 mg/dL (ref 6–23)
Calcium: 9.8 mg/dL (ref 8.4–10.5)
Chloride: 101 mEq/L (ref 96–112)
Glucose, Bld: 98 mg/dL (ref 70–99)
Potassium: 3.9 mEq/L (ref 3.5–5.1)
Sodium: 136 mEq/L (ref 135–145)

## 2012-11-15 LAB — CBC WITH DIFFERENTIAL/PLATELET
Basophils Absolute: 0 10*3/uL (ref 0.0–0.1)
Basophils Relative: 0 % (ref 0–1)
Eosinophils Absolute: 0.2 10*3/uL (ref 0.0–0.7)
HCT: 42.2 % (ref 36.0–46.0)
Hemoglobin: 14.1 g/dL (ref 12.0–15.0)
Lymphs Abs: 1.6 10*3/uL (ref 0.7–4.0)
MCH: 29.4 pg (ref 26.0–34.0)
MCHC: 33.4 g/dL (ref 30.0–36.0)
MCV: 87.9 fL (ref 78.0–100.0)
Monocytes Absolute: 0.5 10*3/uL (ref 0.1–1.0)
Platelets: 246 10*3/uL (ref 150–400)
RBC: 4.8 MIL/uL (ref 3.87–5.11)
RDW: 12.8 % (ref 11.5–15.5)
WBC: 7.3 10*3/uL (ref 4.0–10.5)

## 2012-11-15 MED ORDER — OXYCODONE-ACETAMINOPHEN 5-325 MG PO TABS
1.0000 | ORAL_TABLET | Freq: Once | ORAL | Status: AC
  Administered 2012-11-15: 1 via ORAL
  Filled 2012-11-15: qty 1

## 2012-11-15 MED ORDER — OXYCODONE-ACETAMINOPHEN 5-325 MG PO TABS: 1.0000 | ORAL_TABLET | Freq: Four times a day (QID) | ORAL | Status: AC | PRN

## 2012-11-15 NOTE — ED Notes (Signed)
MD at bedside. 

## 2012-11-15 NOTE — ED Notes (Signed)
Pt was seen at [facility] due to pain behind rt knee that radiates down calf x 1 wk.  Here to r/o DVT.

## 2012-11-15 NOTE — ED Provider Notes (Signed)
CSN: [id number]     Arrival date & time 11/15/12  1255 History   First MD Initiated Contact with Patient 11/15/12 1544     Chief Complaint  Patient presents with  . Knee Pain   (Consider location/radiation/quality/duration/timing/severity/associated sxs/prior Treatment) Patient is a 37 y.o. female presenting with knee pain. The history is provided by the patient.  Knee Pain Location:  Leg Time since incident:  1 week Injury: no   Leg location:  R leg Pain details:    Quality:  Aching   Radiates to:  Does not radiate   Severity:  Moderate   Onset quality:  Gradual   Duration:  1 week   Timing:  Constant   Progression:  Unchanged Chronicity:  New Dislocation: no   Foreign body present:  No foreign bodies Prior injury to area:  No Relieved by:  Nothing Worsened by:  Bearing weight Ineffective treatments:  None tried Associated symptoms: no back pain, no fatigue, no fever and no neck pain     History reviewed. No pertinent past medical history. Past Surgical History  Procedure Laterality Date  . Abdominal hysterectomy     Family History  Problem Relation Age of Onset  . Kidney disease Mother   . Diabetes Father    History  Substance Use Topics  . Smoking status: Never Smoker   . Smokeless tobacco: Not on file  . Alcohol Use: No   OB History   Grav Para Term Preterm Abortions TAB SAB Ect Mult Living                 Review of Systems  Constitutional: Negative for fever and fatigue.  HENT: Negative for congestion and drooling.   Eyes: Negative for pain.  Respiratory: Negative for cough and shortness of breath.   Cardiovascular: Negative for chest pain.  Gastrointestinal: Negative for nausea, vomiting, abdominal pain and diarrhea.  Genitourinary: Negative for dysuria and hematuria.  Musculoskeletal: Negative for back pain, gait problem and neck pain.  Skin: Negative for color change.  Neurological: Negative for dizziness and headaches.  Hematological: Negative  for adenopathy.  Psychiatric/Behavioral: Negative for behavioral problems.  All other systems reviewed and are negative.    Allergies  Sulfa antibiotics  Home Medications   Current Outpatient Rx  Name  Route  Sig  Dispense  Refill  . Multiple Vitamins-Minerals (MULTIVITAMIN WITH MINERALS) tablet   Oral   Take 1 tablet by mouth daily.         . sertraline (ZOLOFT) 100 MG tablet   Oral   Take 1 tablet (100 mg total) by mouth daily.   30 tablet   0    BP 157/104  Pulse 89  Temp(Src) 98.4 F (36.9 C) (Oral)  Resp 16  SpO2 98% Physical Exam  Nursing note and vitals reviewed. Constitutional: She is oriented to person, place, and time. She appears well-developed and well-nourished.  HENT:  Head: Normocephalic.  Mouth/Throat: Oropharynx is clear and moist. No oropharyngeal exudate.  Eyes: Conjunctivae and EOM are normal. Pupils are equal, round, and reactive to light.  Neck: Normal range of motion. Neck supple.  Cardiovascular: Normal rate, regular rhythm, normal heart sounds and intact distal pulses.  Exam reveals no gallop and no friction rub.   No murmur heard. Pulmonary/Chest: Effort normal and breath sounds normal. No respiratory distress. She has no wheezes.  Abdominal: Soft. Bowel sounds are normal. There is no tenderness. There is no rebound and no guarding.  Musculoskeletal: Normal range of motion.  She exhibits no edema and no tenderness.  LE's appear symmetrical. 2+ distal pulses.   Mild ttp of right popliteal area and right calf.   Neurological: She is alert and oriented to person, place, and time.  Skin: Skin is warm and dry.  Psychiatric: She has a normal mood and affect. Her behavior is normal.    ED Course  Procedures (including critical care time) Labs Review Labs Reviewed  BASIC METABOLIC PANEL - Abnormal; Notable for the following:    GFR calc non Af Amer 71 (*)    GFR calc Af Amer 82 (*)    All other components within normal limits  CBC WITH  DIFFERENTIAL  TROPONIN I   Imaging Review Dg Chest 2 View  11/15/2012   CLINICAL DATA:  Shortness of breath and left-sided chest pain  EXAM: CHEST  2 VIEW  COMPARISON:  11/06/2012  FINDINGS: The heart size and mediastinal contours are within normal limits. Both lungs are clear. The visualized skeletal structures are unremarkable.  IMPRESSION: No active cardiopulmonary disease.   Electronically Signed   By: Alcide Clever M.D.   On: 11/15/2012 17:01    EKG Interpretation     Ventricular Rate:  63 PR Interval:  119 QRS Duration: 88 QT Interval:  413 QTC Calculation: 423 R Axis:   20 Text Interpretation:  Sinus rhythm Borderline short PR interval            MDM   1. Right leg pain    4:08 PM 37 y.o. female pw right post knee pain x 1 week. Denies injury, pain radiates to calf. Seen at [facility] sent here for [place] to r/o DVT. Pt also noting cp and mild sob for last 3 days, but has not had sx today. Sx last hours at a time and resolve. Pt states this is c/w her anxiety. Wells/Perc neg. Will get [place] and screening labs. Percocet for pain.   6:34 PM: [place] neg for DVT, pt feeling better. Possibly msk cause of her pain. I have discussed the diagnosis/risks/treatment options with the patient and believe the pt to be eligible for discharge home to follow-up with and estab with a pcp. We also discussed returning to the ED immediately if new or worsening sx occur. We discussed the sx which are most concerning (e.g., worsening pain, sob, cp) that necessitate immediate return. Any new prescriptions provided to the patient are listed below.  Discharge Medication List as of 11/15/2012  6:36 PM    START taking these medications   Details  oxyCODONE-acetaminophen (PERCOCET) 5-325 MG per tablet Take 1 tablet by mouth every 6 (six) hours as needed for pain., Starting 11/15/2012, Until Discontinued, Print         Junius Argyle, MD 11/16/12 [phone]

## 2012-11-15 NOTE — Progress Notes (Signed)
VASCULAR LAB PRELIMINARY  PRELIMINARY  PRELIMINARY  PRELIMINARY  Right lower extremity venous Doppler completed.    Preliminary report:  There is no DVT or SVT noted in the right lower extremity.   [name], RVT 11/15/2012, 5:56 PM

## 2013-04-02 ENCOUNTER — Other Ambulatory Visit: Payer: Self-pay | Admitting: Physician Assistant

## 2013-04-02 ENCOUNTER — Ambulatory Visit
Admission: RE | Admit: 2013-04-02 | Discharge: 2013-04-02 | Disposition: A | Discharge status: Home / Self Care | Payer: BC Managed Care – PPO | Source: Ambulatory Visit | Attending: Physician Assistant | Admitting: Physician Assistant

## 2013-04-02 DIAGNOSIS — R0602 Shortness of breath: Secondary | ICD-10-CM

## 2013-04-02 DIAGNOSIS — R7989 Other specified abnormal findings of blood chemistry: Secondary | ICD-10-CM

## 2013-04-02 DIAGNOSIS — R079 Chest pain, unspecified: Secondary | ICD-10-CM

## 2013-04-02 MED ORDER — IOHEXOL 350 MG/ML SOLN
125.0000 mL | Freq: Once | INTRAVENOUS | Status: AC | PRN
  Administered 2013-04-02: 125 mL via INTRAVENOUS

## 2015-06-01 IMAGING — CR DG CHEST 2V
2 series · 2 of 2 positions shown · non-contrast
Comparison: 11/06/2012

CLINICAL DATA: Shortness of breath and left-sided chest pain

EXAM:
CHEST  2 VIEW

[w chest pa]
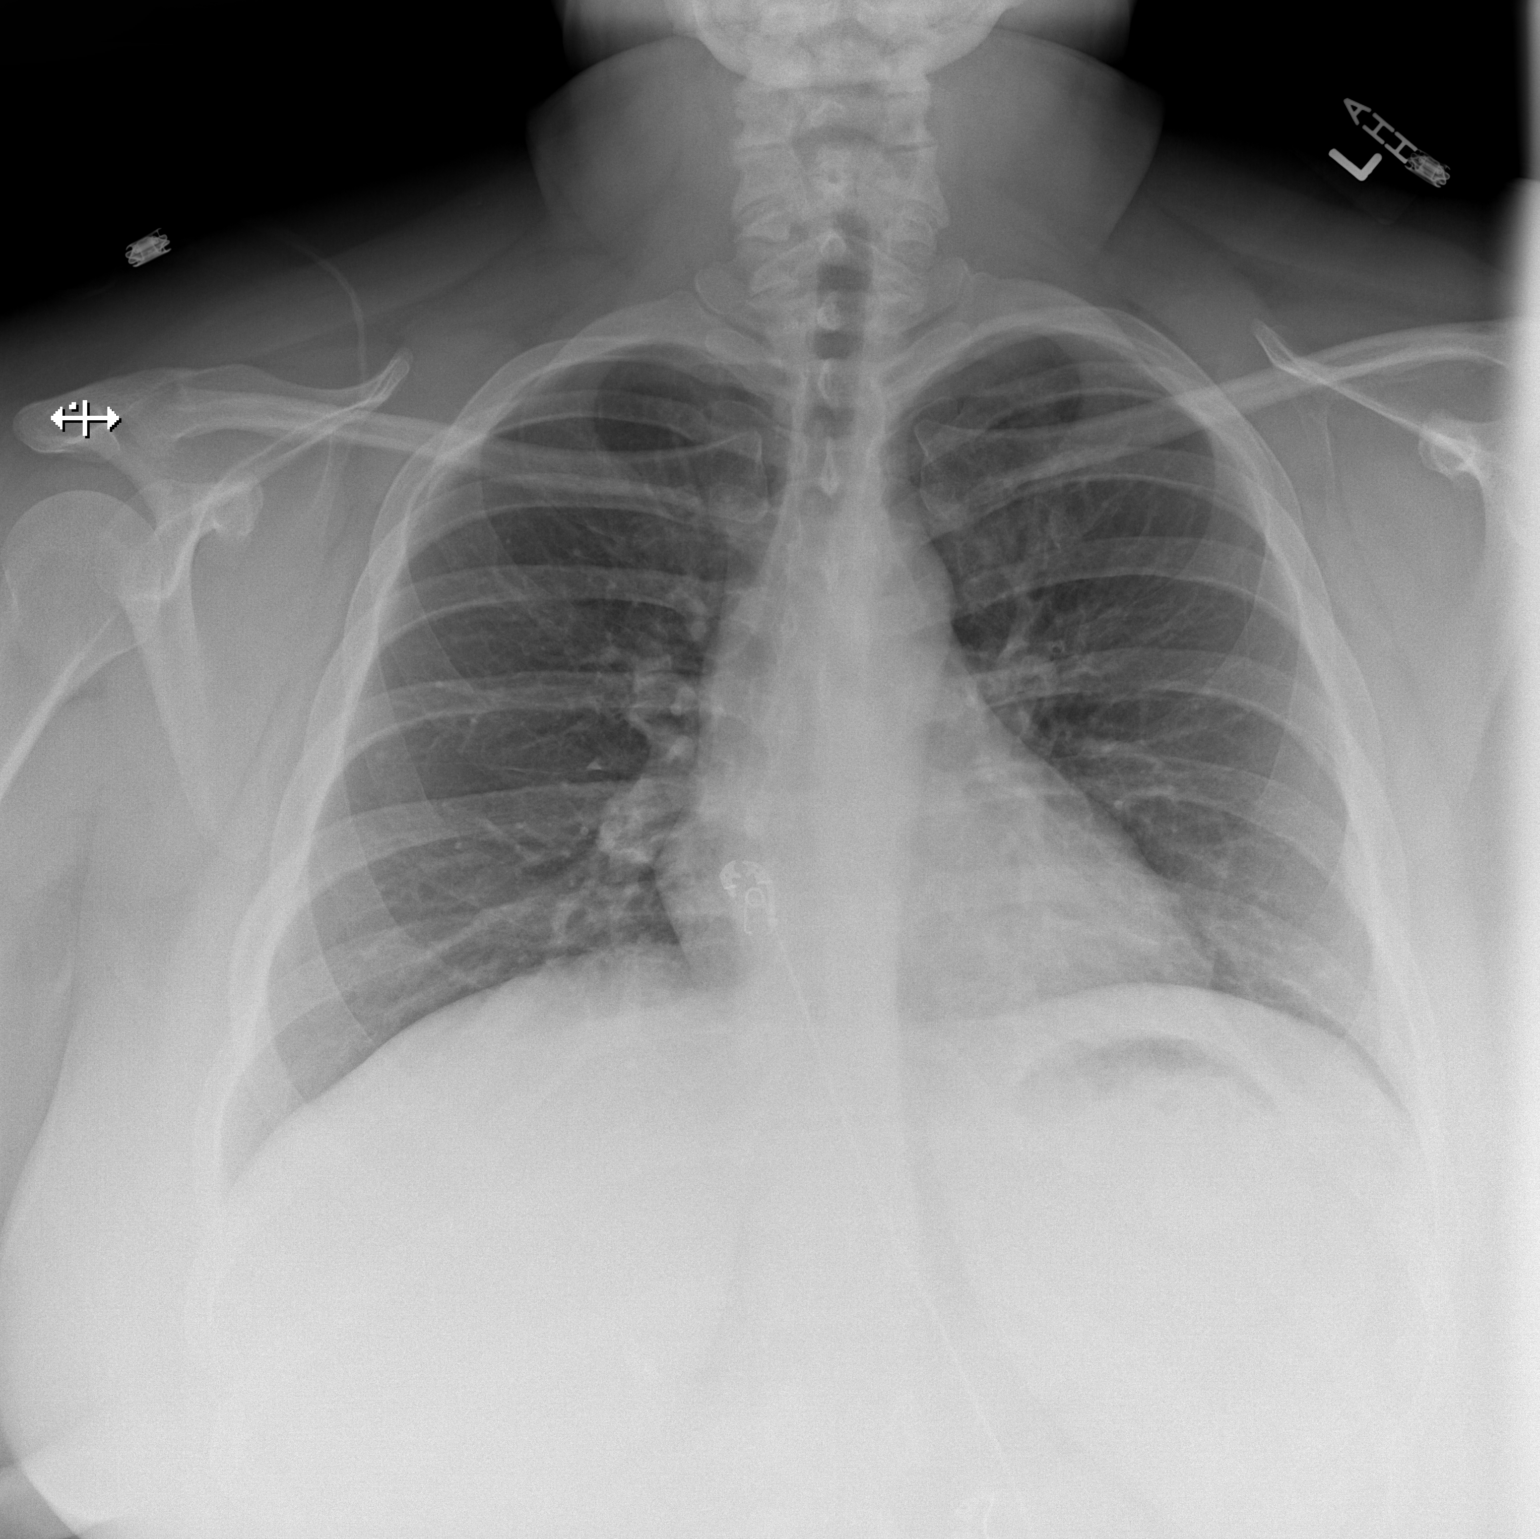

[w chest lat]
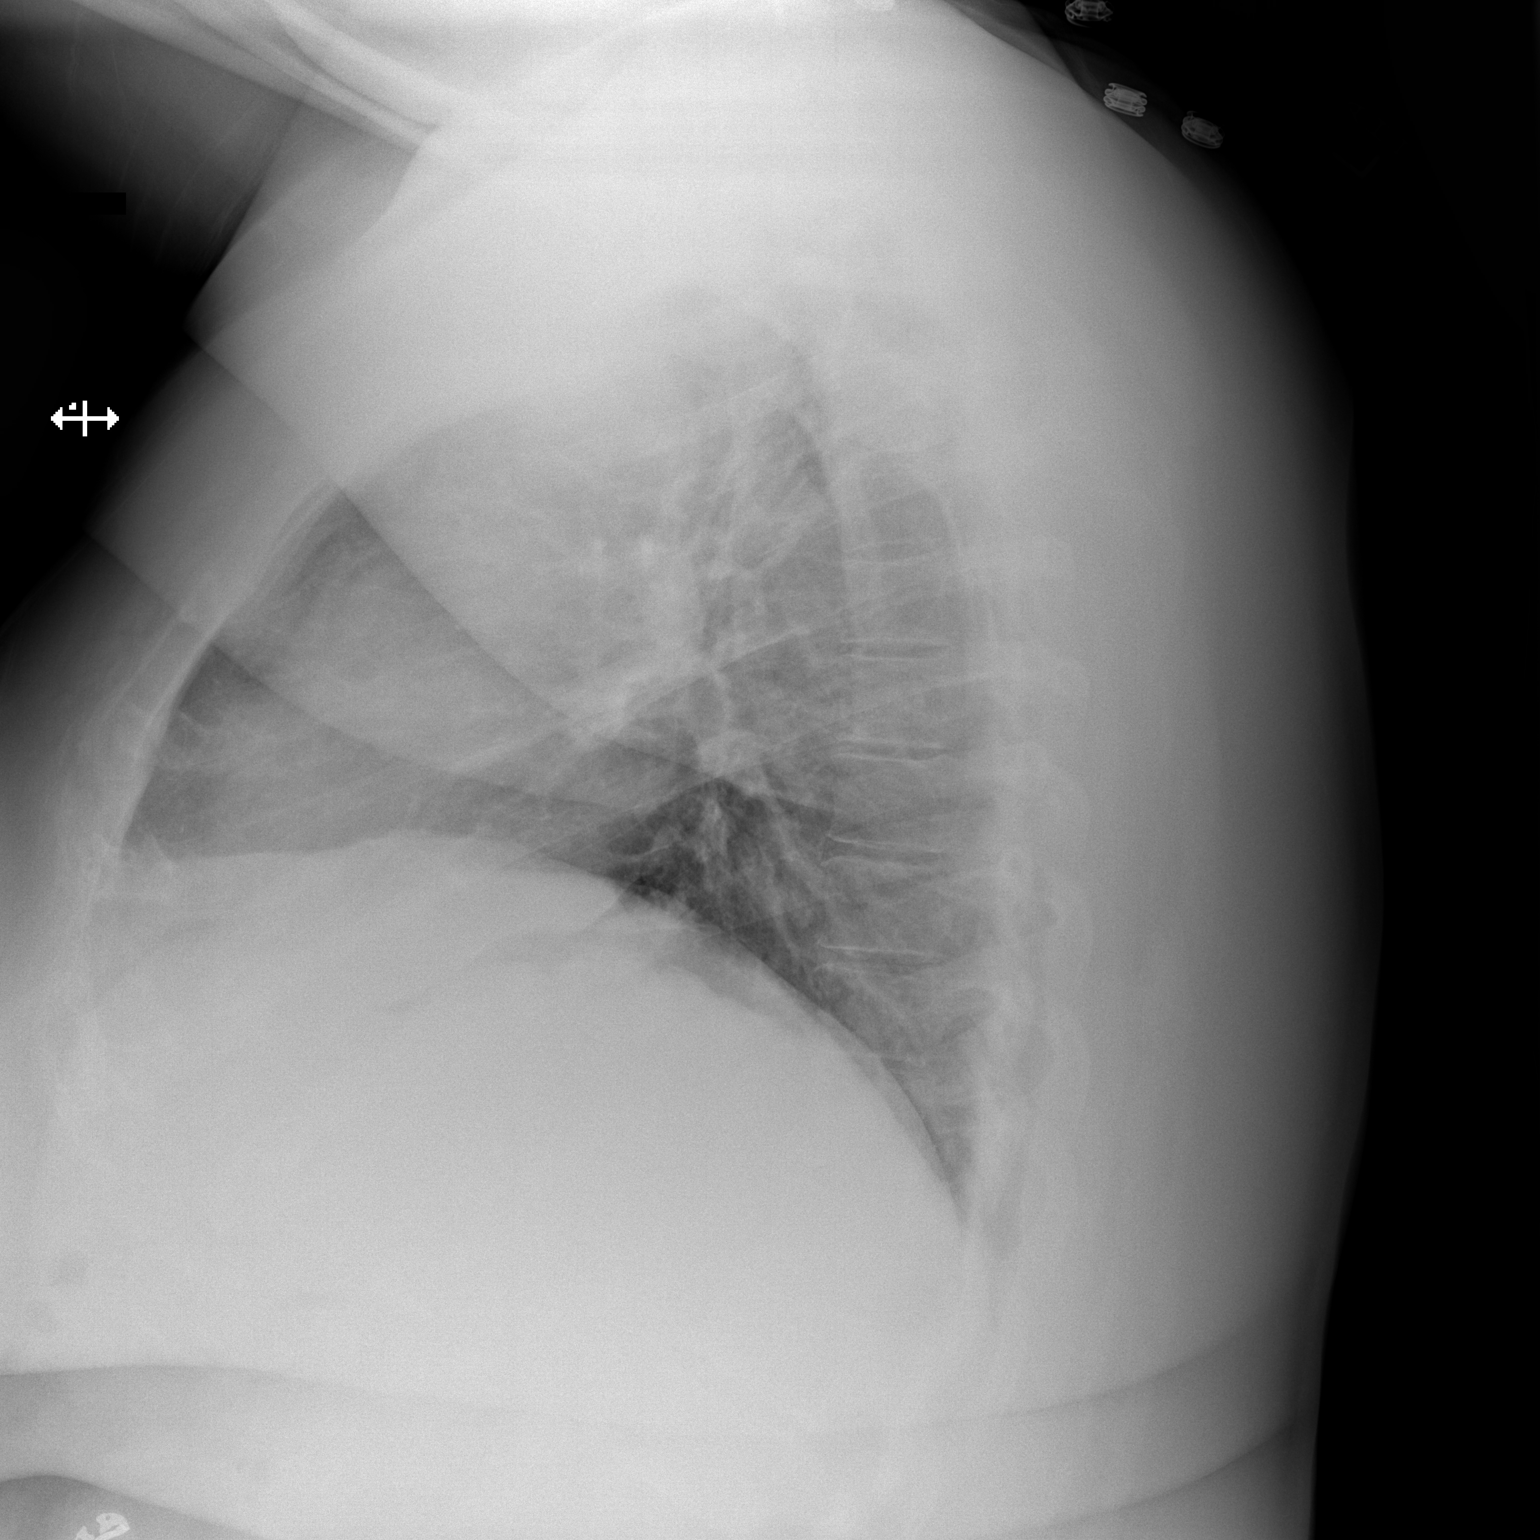

[2 of 2 positions shown; findings below may reference images not displayed]

FINDINGS: The heart size and mediastinal contours are within normal limits.
Both lungs are clear. The visualized skeletal structures are
unremarkable.
IMPRESSION: No active cardiopulmonary disease.
